# Patient Record
Sex: Male | Born: 1995 | Race: White | Hispanic: No | Marital: Single | Smoking: Never smoker
Health system: Southern US, Community
[De-identification: ages and names within clinical notes are randomized; demographics above are authoritative.]

## PROBLEM LIST (undated history)

## (undated) DIAGNOSIS — R002 Palpitations: Secondary | ICD-10-CM

## (undated) HISTORY — PX: WISDOM TOOTH EXTRACTION: SHX21

## (undated) HISTORY — DX: Palpitations: R00.2

---

## 2014-04-18 ENCOUNTER — Emergency Department (HOSPITAL_COMMUNITY)
Admission: EM | Admit: 2014-04-18 | Discharge: 2014-04-18 | Disposition: A | Discharge status: Home / Self Care | Payer: 59 | Attending: Emergency Medicine | Admitting: Emergency Medicine

## 2014-04-18 ENCOUNTER — Emergency Department (HOSPITAL_COMMUNITY): Payer: 59

## 2014-04-18 ENCOUNTER — Encounter (HOSPITAL_COMMUNITY): Payer: Self-pay | Admitting: Emergency Medicine

## 2014-04-18 DIAGNOSIS — R002 Palpitations: Secondary | ICD-10-CM

## 2014-04-18 DIAGNOSIS — I493 Ventricular premature depolarization: Secondary | ICD-10-CM | POA: Diagnosis not present

## 2014-04-18 LAB — RAPID URINE DRUG SCREEN, HOSP PERFORMED
AMPHETAMINES: NOT DETECTED
BARBITURATES: NOT DETECTED
BENZODIAZEPINES: NOT DETECTED
COCAINE: NOT DETECTED
Opiates: NOT DETECTED
Tetrahydrocannabinol: NOT DETECTED

## 2014-04-18 LAB — CBC
HEMATOCRIT: 43.3 % (ref 39.0–52.0)
HEMOGLOBIN: 15.4 g/dL (ref 13.0–17.0)
MCH: 31.6 pg (ref 26.0–34.0)
MCHC: 35.6 g/dL (ref 30.0–36.0)
MCV: 88.7 fL (ref 78.0–100.0)
Platelets: 216 10*3/uL (ref 150–400)
RBC: 4.88 MIL/uL (ref 4.22–5.81)
RDW: 11.7 % (ref 11.5–15.5)
WBC: 9.5 10*3/uL (ref 4.0–10.5)

## 2014-04-18 LAB — BASIC METABOLIC PANEL
Anion gap: 13 (ref 5–15)
BUN: 15 mg/dL (ref 6–23)
CALCIUM: 9.2 mg/dL (ref 8.4–10.5)
CO2: 24 mEq/L (ref 19–32)
CREATININE: 0.84 mg/dL (ref 0.50–1.35)
Chloride: 103 mEq/L (ref 96–112)
GLUCOSE: 102 mg/dL — AB (ref 70–99)
POTASSIUM: 3.7 meq/L (ref 3.7–5.3)
Sodium: 140 mEq/L (ref 137–147)

## 2014-04-18 LAB — MAGNESIUM: Magnesium: 2.2 mg/dL (ref 1.5–2.5)

## 2014-04-18 LAB — I-STAT TROPONIN, ED: Troponin i, poc: 0 ng/mL (ref 0.00–0.08)

## 2014-04-18 NOTE — ED Notes (Signed)
Pt states that he has been having intermittent palpitations off and on for approx 1 week; pt described it as a "subwoofer" in his chest; pt states that the palpitations got worse and more consistent today; pt denies chest pain; pt denies [facility]

## 2014-04-18 NOTE — ED Notes (Signed)
Pt became diaphoretic and pale when getting blood drawn, pt's blood pressure went into the 60's, blood pressure now 90/44, hr 61

## 2014-04-18 NOTE — ED Notes (Signed)
Pt states for the last 2 weeks has been feeling hear palpitations on/off, states tonight when going to bed it started but didn't stop like it normally does, pt states when it does happen he has no other symptoms, denies chest pain, denies SOB, pt states he feels fine at this time, pt in no distress.

## 2014-04-18 NOTE — ED Provider Notes (Signed)
CSN: [id number]     Arrival date & time 04/18/14  0057 History   First MD Initiated Contact with Patient 04/18/14 0214     Chief Complaint  Patient presents with  . Palpitations     (Consider location/radiation/quality/duration/timing/severity/associated sxs/prior Treatment) HPI Comments: Patient is a 18 year old otherwise healthy male who presents to the emergency department for evaluation of palpitations. He reports that for the past week he has been experiencing intermittent palpitations and a sensation of "heavy heartbeats". He reports that today the palpitations lasted longer than normal which is what prompted him to come to the emergency department. He has palpitations anywhere from a couple of beats to 1 minute. He is unsure of anything that triggers this. He denies any chest pain, shortness of breath, pleuritic pain, diaphoresis, lightheadedness. No syncope. No family history of early heart disease. Today he did have an episode of diaphoresis, lightheadedness, and became pale while he was getting his blood drawn. This resolved shortly after blood was drawn.  Patient is a 18 y.o. male presenting with palpitations. The history is provided by the patient. No language interpreter was used.  Palpitations Associated symptoms: no chest pain, no diaphoresis, no dizziness, no nausea, no shortness of breath and no vomiting     History reviewed. No pertinent past medical history. Past Surgical History  Procedure Laterality Date  . Wisdom tooth extraction     No family history on file. History  Substance Use Topics  . Smoking status: Never Smoker   . Smokeless tobacco: Not on file  . Alcohol Use: No    Review of Systems  Constitutional: Negative for fever, chills and diaphoresis.  Respiratory: Negative for shortness of breath.   Cardiovascular: Positive for palpitations. Negative for chest pain and leg swelling.  Gastrointestinal: Negative for nausea, vomiting and abdominal pain.   Neurological: Negative for dizziness, syncope and light-headedness.  All other systems reviewed and are negative.     Allergies  Review of patient's allergies indicates no known allergies.  Home Medications   Prior to Admission medications   Medication Sig Start Date End Date Taking? Authorizing Provider  ibuprofen (ADVIL,MOTRIN) 200 MG tablet Take 400 mg by mouth every 6 (six) hours as needed for moderate pain.   Yes Historical Provider, MD   BP 103/66  Pulse 76  Temp(Src) 98.1 F (36.7 C) (Oral)  Resp 17  Ht 5\' 9"  (1.753 m)  Wt 135 lb (61.236 kg)  BMI 19.93 kg/m2  SpO2 100% Physical Exam  Nursing note and vitals reviewed. Constitutional: He is oriented to person, place, and time. He appears well-developed and well-nourished. No distress.  HENT:  Head: Normocephalic and atraumatic.  Right Ear: External ear normal.  Left Ear: External ear normal.  Nose: Nose normal.  Eyes: Conjunctivae are normal.  Neck: Normal range of motion. No tracheal deviation present.  Cardiovascular: Normal rate, normal heart sounds, intact distal pulses and normal pulses.   Pulses:      Radial pulses are 2+ on the right side, and 2+ on the left side.  PVCs on cardiac exam.   Pulmonary/Chest: Effort normal and breath sounds normal. No stridor.  Abdominal: Soft. He exhibits no distension. There is no tenderness.  Musculoskeletal: Normal range of motion.  Neurological: He is alert and oriented to person, place, and time.  Skin: Skin is warm and dry. He is not diaphoretic.  Psychiatric: He has a normal mood and affect. His behavior is normal.    ED Course  Procedures (including critical  care time) Labs Review Labs Reviewed  BASIC METABOLIC PANEL - Abnormal; Notable for the following:    Glucose, Bld 102 (*)    All other components within normal limits  CBC  MAGNESIUM  URINE RAPID DRUG SCREEN (HOSP PERFORMED)  I-STAT TROPOININ, ED    Imaging Review Dg Chest 2 View  04/18/2014    CLINICAL DATA:  Palpitations  EXAM: CHEST  2 VIEW  COMPARISON:  None.  FINDINGS: Lungs are clear.  No pleural effusion or pneumothorax.  The heart is normal in size.  Visualized osseous structures are within normal limits.  IMPRESSION: Normal chest radiographs.   Electronically Signed   By: Charline BillsSriyesh  Krishnan M.D.   On: 04/18/2014 02:07     EKG Interpretation   Date/Time:  Thursday April 18 2014 01:07:43 EDT Ventricular Rate:  80 PR Interval:  145 QRS Duration: 102 QT Interval:  362 QTC Calculation: 418 R Axis:   26 Text Interpretation:  Sinus rhythm No acute changes Confirmed by Rhunette CroftNANAVATI,  MD, Janey GentaANKIT [phone]([id number]) on 04/18/2014 3:23:09 AM      MDM   Final diagnoses:  Palpitations    The patient is an otherwise healthy 18 year old male who presents emergency department for evaluation of palpitations. EKG shows PVCs. Labs are unremarkable. Patient denies lightheadedness, syncope, diaphoresis, nausea. He will followup with his primary care physician or cardiology. Discussed reasons to return to emergency Department immediately. Patient and mother expressed understanding. Vital signs stable for discharge. Discussed case with Dr. Rhunette CroftNanavati who agrees with plan. Patient / Family / Caregiver informed of clinical course, understand medical decision-making process, and agree with plan.     Mora BellmanHannah S Mylea Roarty, PA-C 04/18/14 [phone]

## 2014-04-18 NOTE — Discharge Instructions (Signed)
Palpitations °A palpitation is the feeling that your heartbeat is irregular or is faster than normal. It may feel like your heart is fluttering or skipping a beat. Palpitations are usually not a serious problem. However, in some cases, you may need further medical evaluation. °CAUSES  °Palpitations can be caused by: °· Smoking. °· Caffeine or other stimulants, such as diet pills or energy drinks. °· Alcohol. °· Stress and anxiety. °· Strenuous physical activity. °· Fatigue. °· Certain medicines. °· Heart disease, especially if you have a history of irregular heart rhythms (arrhythmias), such as atrial fibrillation, atrial flutter, or supraventricular tachycardia. °· An improperly working pacemaker or defibrillator. °DIAGNOSIS  °To find the cause of your palpitations, your health care provider will take your medical history and perform a physical exam. Your health care provider may also have you take a test called an ambulatory electrocardiogram (ECG). An ECG records your heartbeat patterns over a 24-hour period. You may also have other tests, such as: °· Transthoracic echocardiogram (TTE). During echocardiography, sound waves are used to evaluate how blood flows through your heart. °· Transesophageal echocardiogram (TEE). °· Cardiac monitoring. This allows your health care provider to monitor your heart rate and rhythm in real time. °· Holter monitor. This is a portable device that records your heartbeat and can help diagnose heart arrhythmias. It allows your health care provider to track your heart activity for several days, if needed. °· Stress tests by exercise or by giving medicine that makes the heart beat faster. °TREATMENT  °Treatment of palpitations depends on the cause of your symptoms and can vary greatly. Most cases of palpitations do not require any treatment other than time, relaxation, and monitoring your symptoms. Other causes, such as atrial fibrillation, atrial flutter, or supraventricular  tachycardia, usually require further treatment. °HOME CARE INSTRUCTIONS  °· Avoid: °¨ Caffeinated coffee, tea, soft drinks, diet pills, and energy drinks. °¨ Chocolate. °¨ Alcohol. °· Stop smoking if you smoke. °· Reduce your stress and anxiety. Things that can help you relax include: °¨ A method of controlling things in your body, such as your heartbeats, with your mind (biofeedback). °¨ Yoga. °¨ Meditation. °¨ Physical activity such as swimming, jogging, or walking. °· Get plenty of rest and sleep. °SEEK MEDICAL CARE IF:  °· You continue to have a fast or irregular heartbeat beyond 24 hours. °· Your palpitations occur more often. °SEEK IMMEDIATE MEDICAL CARE IF: °· You have chest pain or shortness of breath. °· You have a severe headache. °· You feel dizzy or you faint. °MAKE SURE YOU: °· Understand these instructions. °· Will watch your condition. °· Will get help right away if you are not doing well or get worse. °Document Released: 07/02/2000 Document Revised: 07/10/2013 Document Reviewed: 09/03/2011 °ExitCare® Patient Information ©2015 [employer]. This information is not intended to replace advice given to you by your health care provider. Make sure you discuss any questions you have with your health care provider. ° °Holter Monitoring °A Holter monitor is a small device with electrodes (small sticky patches) that attach to your chest. It records the electrical activity of your heart and is worn continuously for 24-48 hours.  °A HOLTER MONITOR IS USED TO °· Detect heart problems such as: °¨ Heart arrhythmia. Is an abnormal or irregular heartbeat. With some heart arrhythmias, you may not feel or know that you have an irregular heart rhythm. °¨ Palpitations, such as feeling your heart racing or fluttering. It is possible to have heart palpitations and not have   a heart arrhythmia. °¨ A heart rhythm that is too slow or too fast. °· If you have problems fainting, near fainting or feeling light-headed, a Holter  monitor may be worn to see if your heart is the cause. °HOLTER MONITOR PREPARATION  °· Electrodes will be attached to the skin on your chest. °· If you have hair on your chest, small areas may have to be shaved. This is done to help the patches stick better and make the recording more accurate. °· The electrodes are attached by wires to the Holter monitor. The Holter monitor clips to your clothing. You will wear the monitor at all times, even while exercising and sleeping. °HOME CARE INSTRUCTIONS  °· Wear your monitor at all times. °¨ The wires and the monitor must stay dry. Do not get the monitor wet. °¨ Do not bathe, swim or use a hot tub with it on. °¨ You may do a "sponge" bath while you have the monitor on. °· Keep your skin clean, do not put body lotion or moisturizer on your chest. °· It's possible that your skin under the electrodes could become irritated. To keep this from happening, you may put the electrodes in slightly different places on your chest. °· Your caregiver will also ask you to keep a diary of your activities, such as walking or doing chores. Be sure to note what you are doing if you experience heart symptoms such as palpitations. This will help your caregiver determine what might be contributing to your symptoms. The information stored in your monitor will be reviewed by your caregiver alongside your diary entries. °· Make sure the monitor is safely clipped to your clothing or in a location close to your body that your caregiver recommends. °· The monitor and electrodes are removed when the test is over. Return the monitor as directed. °· Be sure to follow up with your caregiver and discuss your Holter monitor results. °SEEK IMMEDIATE MEDICAL CARE IF: °· You faint or feel lightheaded. °· You have trouble breathing. °· You get pain in your chest, upper arm or jaw. °· You feel sick to your stomach and your skin is pale, cool, or damp. °· You think something is wrong with the way your heart is  beating. °MAKE SURE YOU:  °· Understand these instructions. °· Will watch your condition. °· Will get help right away if you are not doing well or get worse. °Document Released: 04/02/2004 Document Revised: 09/27/2011 Document Reviewed: 08/15/2008 °ExitCare® Patient Information ©2015 [employer]. This information is not intended to replace advice given to you by your health care provider. Make sure you discuss any questions you have with your health care provider. ° °

## 2014-04-19 ENCOUNTER — Encounter: Payer: Self-pay | Admitting: Cardiovascular Disease

## 2014-04-19 ENCOUNTER — Ambulatory Visit (INDEPENDENT_AMBULATORY_CARE_PROVIDER_SITE_OTHER): Payer: 59 | Admitting: Cardiovascular Disease

## 2014-04-19 VITALS — BP 102/64 | HR 65 | Ht 69.0 in | Wt 129.0 lb

## 2014-04-19 DIAGNOSIS — R002 Palpitations: Secondary | ICD-10-CM

## 2014-04-19 LAB — T4, FREE: Free T4: 1.04 ng/dL (ref 0.80–1.80)

## 2014-04-19 LAB — TSH: TSH: 1.474 u[IU]/mL (ref 0.350–4.500)

## 2014-04-19 NOTE — ED Provider Notes (Signed)
Medical screening examination/treatment/procedure(s) were conducted as a shared visit with non-physician practitioner(s) and myself.  I personally evaluated the patient during the encounter.   EKG Interpretation   Date/Time:  Thursday April 18 2014 01:07:43 EDT Ventricular Rate:  80 PR Interval:  145 QRS Duration: 102 QT Interval:  362 QTC Calculation: 418 R Axis:   26 Text Interpretation:  Sinus rhythm No acute changes Confirmed by Courtenay Creger,  MD, Antonia Jicha ([id number]) on 04/18/2014 3:23:09 AM      Pt with cc of palpitations. Denies substance abuse. No family hx of premature CAD or unexplained deaths and EKG shows PVC. No syncope. Will advice Cards f/u, as his symptoms are severe.  Derwood KaplanAnkit Cleophus Mendonsa, MD 04/19/14 [phone]

## 2014-04-19 NOTE — Assessment & Plan Note (Signed)
Manuel Gray is a 18 year old thin appearing single and healthy young male who went to the emergency room for symptomatic palpitations. These began about 2 weeks ago and become more frequent over the last week now occurring daily. He drinks 5 caffeinated beverages a day. He does not use decongestants. There is no other issues going on in his life. His left lateral normal at the hospital at EKG showed no acute changes. I suspect his palpitations are PVCs. I wonder why all of a sudden he is developing some increasing in frequency. I'm going to get thyroid function tests, 2-D echo and event monitor. I have counseled him to limit his caffeine intake. I will see him back in one month

## 2014-04-19 NOTE — Progress Notes (Signed)
04/19/2014 Alvino Blood   December 27, 1995  [id number]  Primary Physician No primary provider on file. Primary Cardiologist: Runell Gess MD Roseanne Reno   HPI:  Manuel Gray is a 18 year old appearing single Caucasian male by his mother today. He was referred by Ms. Emergency room for cardiovascular evaluation because of new-onset palpitations. He works as a Psychiatrist. He does not drink, smoke tobacco or use illicit drugs. He doesn't drink 3-5 caffeinated beverages a day. Otherwise his past medical history is unremarkable. There is no family history of heart disease. He's noticed palpitations beginning 2 weeks ago which have increased in frequency and severity over the last week prompting an ER visit. Workup there was unremarkable including routine lab work and EKG. The palpitations are not associated with any other symptoms such as chest pain shortness of breath or presyncope.   Current Outpatient Prescriptions  Medication Sig Dispense Refill  . ibuprofen (ADVIL,MOTRIN) 200 MG tablet Take 400 mg by mouth every 6 (six) hours as needed for moderate pain.       No current facility-administered medications for this visit.    No Known Allergies  History   Social History  . Marital Status: Single    Spouse Name: N/A    Number of Children: N/A  . Years of Education: N/A   Occupational History  . Not on file.   Social History Main Topics  . Smoking status: Never Smoker   . Smokeless tobacco: Not on file  . Alcohol Use: No  . Drug Use: No  . Sexual Activity: Not on file   Other Topics Concern  . Not on file   Social History Narrative  . No narrative on file     Review of Systems: General: negative for chills, fever, night sweats or weight changes.  Cardiovascular: negative for chest pain, dyspnea on exertion, edema, orthopnea, palpitations, paroxysmal nocturnal dyspnea or shortness of breath Dermatological: negative for rash Respiratory: negative for cough  or wheezing Urologic: negative for hematuria Abdominal: negative for nausea, vomiting, diarrhea, bright red blood per rectum, melena, or hematemesis Neurologic: negative for visual changes, syncope, or dizziness All other systems reviewed and are otherwise negative except as noted above.    Blood pressure 102/64, pulse 65, height 5\' 9"  (1.753 m), weight 129 lb (58.514 kg).  General appearance: alert and no distress Neck: no adenopathy, no carotid bruit, no JVD, supple, symmetrical, trachea midline and thyroid not enlarged, symmetric, no tenderness/mass/nodules Lungs: clear to auscultation bilaterally Heart: regular rate and rhythm, S1, S2 normal, no murmur, click, rub or gallop Extremities: extremities normal, atraumatic, no cyanosis or edema  EKG normal sinus rhythm at 65 without ST or T wave changes  ASSESSMENT AND PLAN:   Palpitations Preet is a 18 year old thin appearing single and healthy young male who went to the emergency room for symptomatic palpitations. These began about 2 weeks ago and become more frequent over the last week now occurring daily. He drinks 5 caffeinated beverages a day. He does not use decongestants. There is no other issues going on in his life. His left lateral normal at the hospital at EKG showed no acute changes. I suspect his palpitations are PVCs. I wonder why all of a sudden he is developing some increasing in frequency. I'm going to get thyroid function tests, 2-D echo and event monitor. I have counseled him to limit his caffeine intake. I will see him back in one month      Runell Gess MD  FACP,FACC,FAHA, FSCAI 04/19/2014 9:31 AM

## 2014-04-19 NOTE — Patient Instructions (Signed)
  We will see you back in follow up in 1 month with Dr Allyson SabalBerry.   Dr Allyson SabalBerry has ordered: 1.  Event monitor. Event monitors are medical devices that record the heart's electrical activity. Doctors most often us these monitors to diagnose arrhythmias. Arrhythmias are problems with the speed or rhythm of the heartbeat. The monitor is a small, portable device. You can wear one while you do your normal daily activities. This is usually used to diagnose what is causing palpitations/syncope (passing out).  2. Have blood work done today.   3.  Echocardiogram. Echocardiography is a painless test that uses sound waves to create images of your heart. It provides your doctor with information about the size and shape of your heart and how well your heart's chambers and valves are working. This procedure takes approximately one hour. There are no restrictions for this procedure.

## 2014-04-22 ENCOUNTER — Encounter: Payer: Self-pay | Admitting: *Deleted

## 2014-04-24 ENCOUNTER — Ambulatory Visit (HOSPITAL_COMMUNITY)
Admission: RE | Admit: 2014-04-24 | Discharge: 2014-04-24 | Disposition: A | Discharge status: Home / Self Care | Payer: 59 | Source: Ambulatory Visit | Attending: Cardiovascular Disease | Admitting: Cardiovascular Disease

## 2014-04-24 DIAGNOSIS — I369 Nonrheumatic tricuspid valve disorder, unspecified: Secondary | ICD-10-CM

## 2014-04-24 DIAGNOSIS — R002 Palpitations: Secondary | ICD-10-CM | POA: Insufficient documentation

## 2014-04-24 NOTE — Progress Notes (Signed)
2D Echo Performed 04/24/2014    Rahn Lacuesta, RCS  

## 2014-04-26 ENCOUNTER — Encounter: Payer: Self-pay | Admitting: *Deleted

## 2014-05-10 ENCOUNTER — Encounter: Payer: Self-pay | Admitting: Cardiovascular Disease

## 2014-06-04 ENCOUNTER — Ambulatory Visit (INDEPENDENT_AMBULATORY_CARE_PROVIDER_SITE_OTHER): Payer: 59 | Admitting: Cardiovascular Disease

## 2014-06-04 ENCOUNTER — Encounter: Payer: Self-pay | Admitting: Cardiovascular Disease

## 2014-06-04 VITALS — BP 100/62 | HR 80 | Ht 69.0 in | Wt 139.2 lb

## 2014-06-04 DIAGNOSIS — R002 Palpitations: Secondary | ICD-10-CM

## 2014-06-04 NOTE — Progress Notes (Signed)
Manuel PilgrimJacob returns for follow-up of his outpatient workup for palpitations. His lab work was unrevealing. His 2-D echo was normal. His event monitor showed only unifocal PVCs. I recently saw him and his mother that these are benign. His symptoms have markedly improved over the last several weeks. I will see him back as needed.  Runell GessJonathan J. Kaila Devries, M.D., FACP, [facility] LLCFACC, Earl LagosFAHA, [facility] [address] [place], [place]  [id number]  [phone] 06/04/2014 8:23 AM

## 2014-06-04 NOTE — Patient Instructions (Signed)
Follow up with Dr Berry as needed.  

## 2014-06-04 NOTE — Assessment & Plan Note (Addendum)
Manuel PilgrimJacob had a 2-D echo which was entirely normal as well as event monitor that showed unifocal PVCs. His TSH was normal as well. He has decreased his intake of caffeine. He says over the last several weeks his palpitations have markedly improved. I reassured him that these are most likely benign. I will see him back as needed.

## 2016-05-03 IMAGING — CR DG CHEST 2V
2 series · 2 of 2 positions shown · non-contrast
Comparison: None.

CLINICAL DATA: Palpitations

EXAM:
CHEST  2 VIEW

[w chest pa]
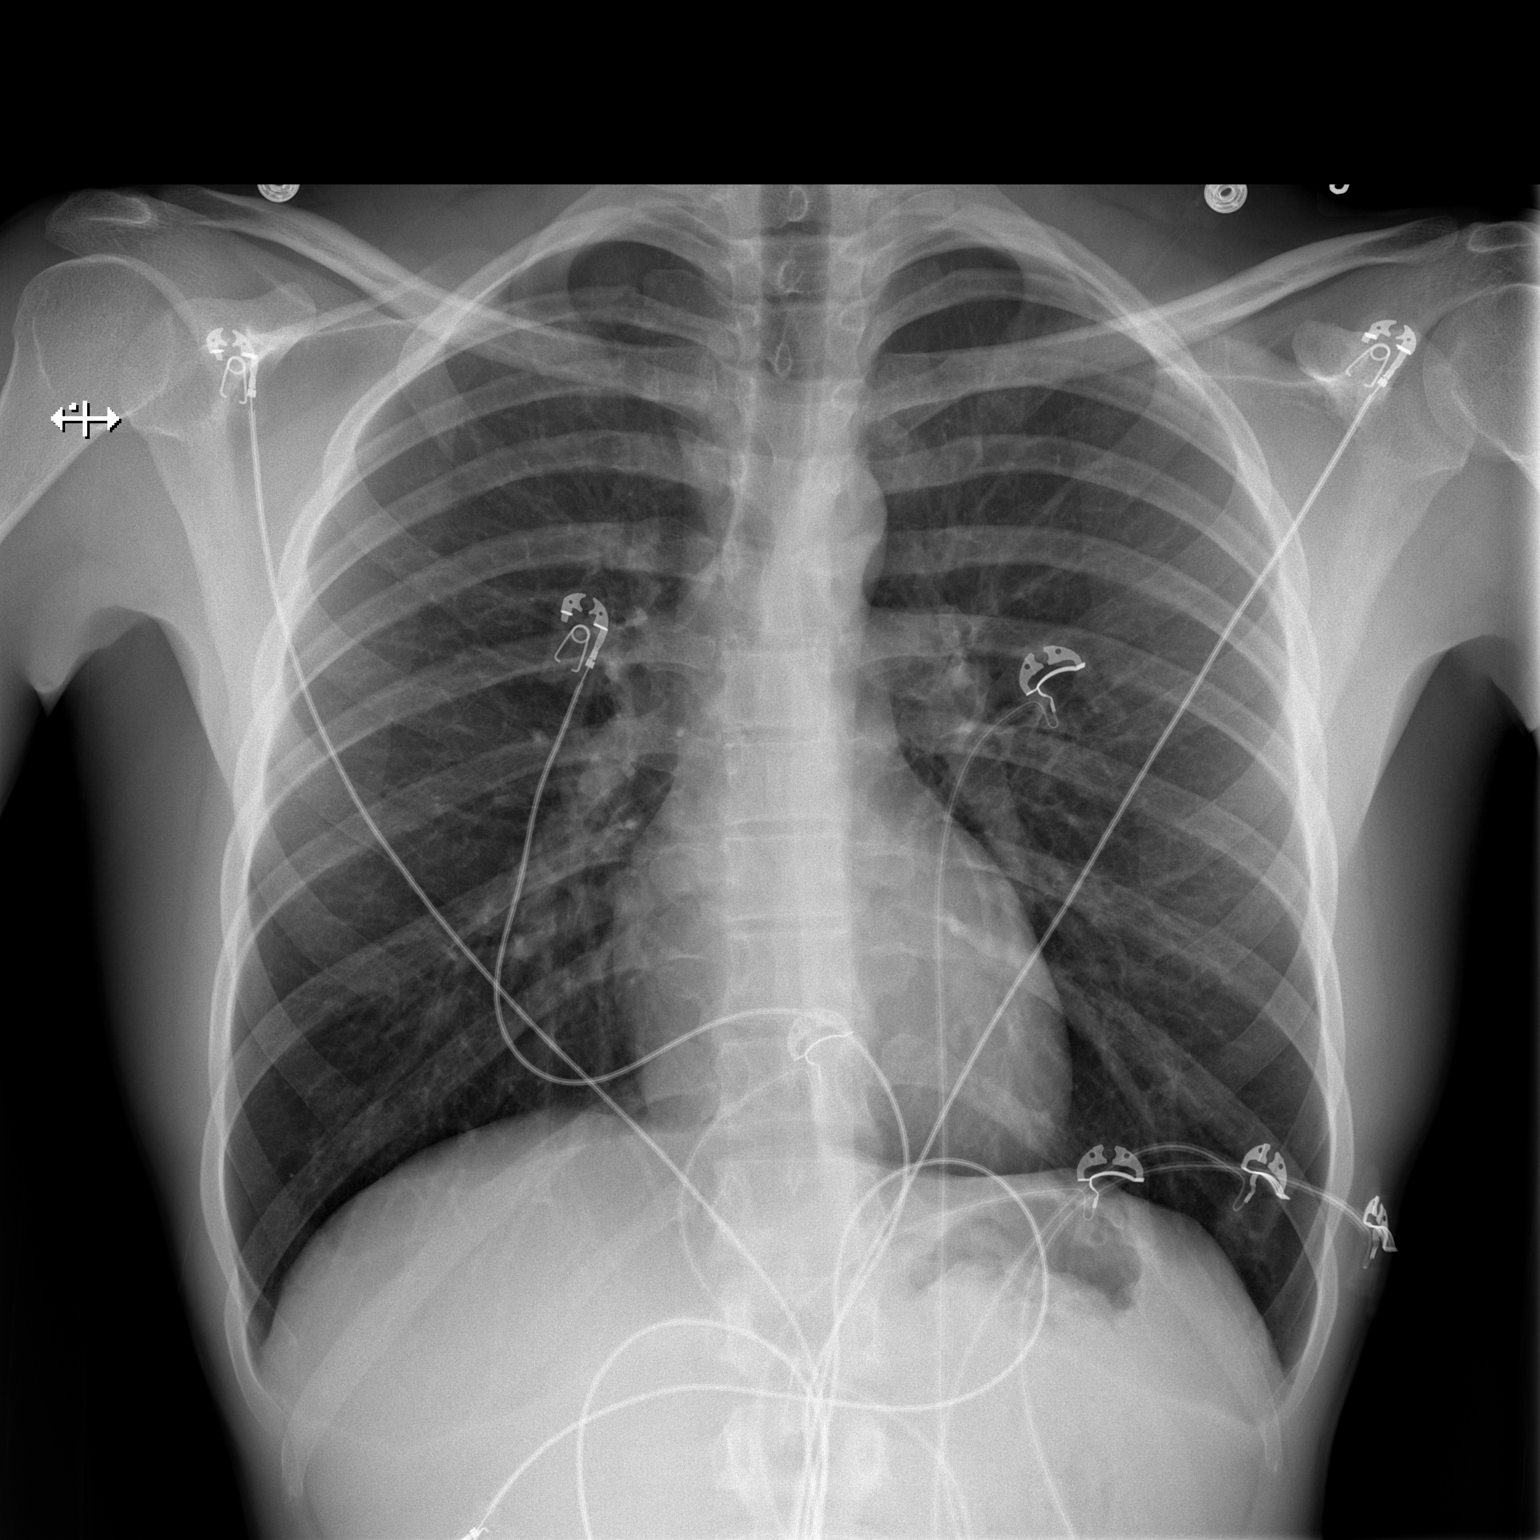

[w chest lat]
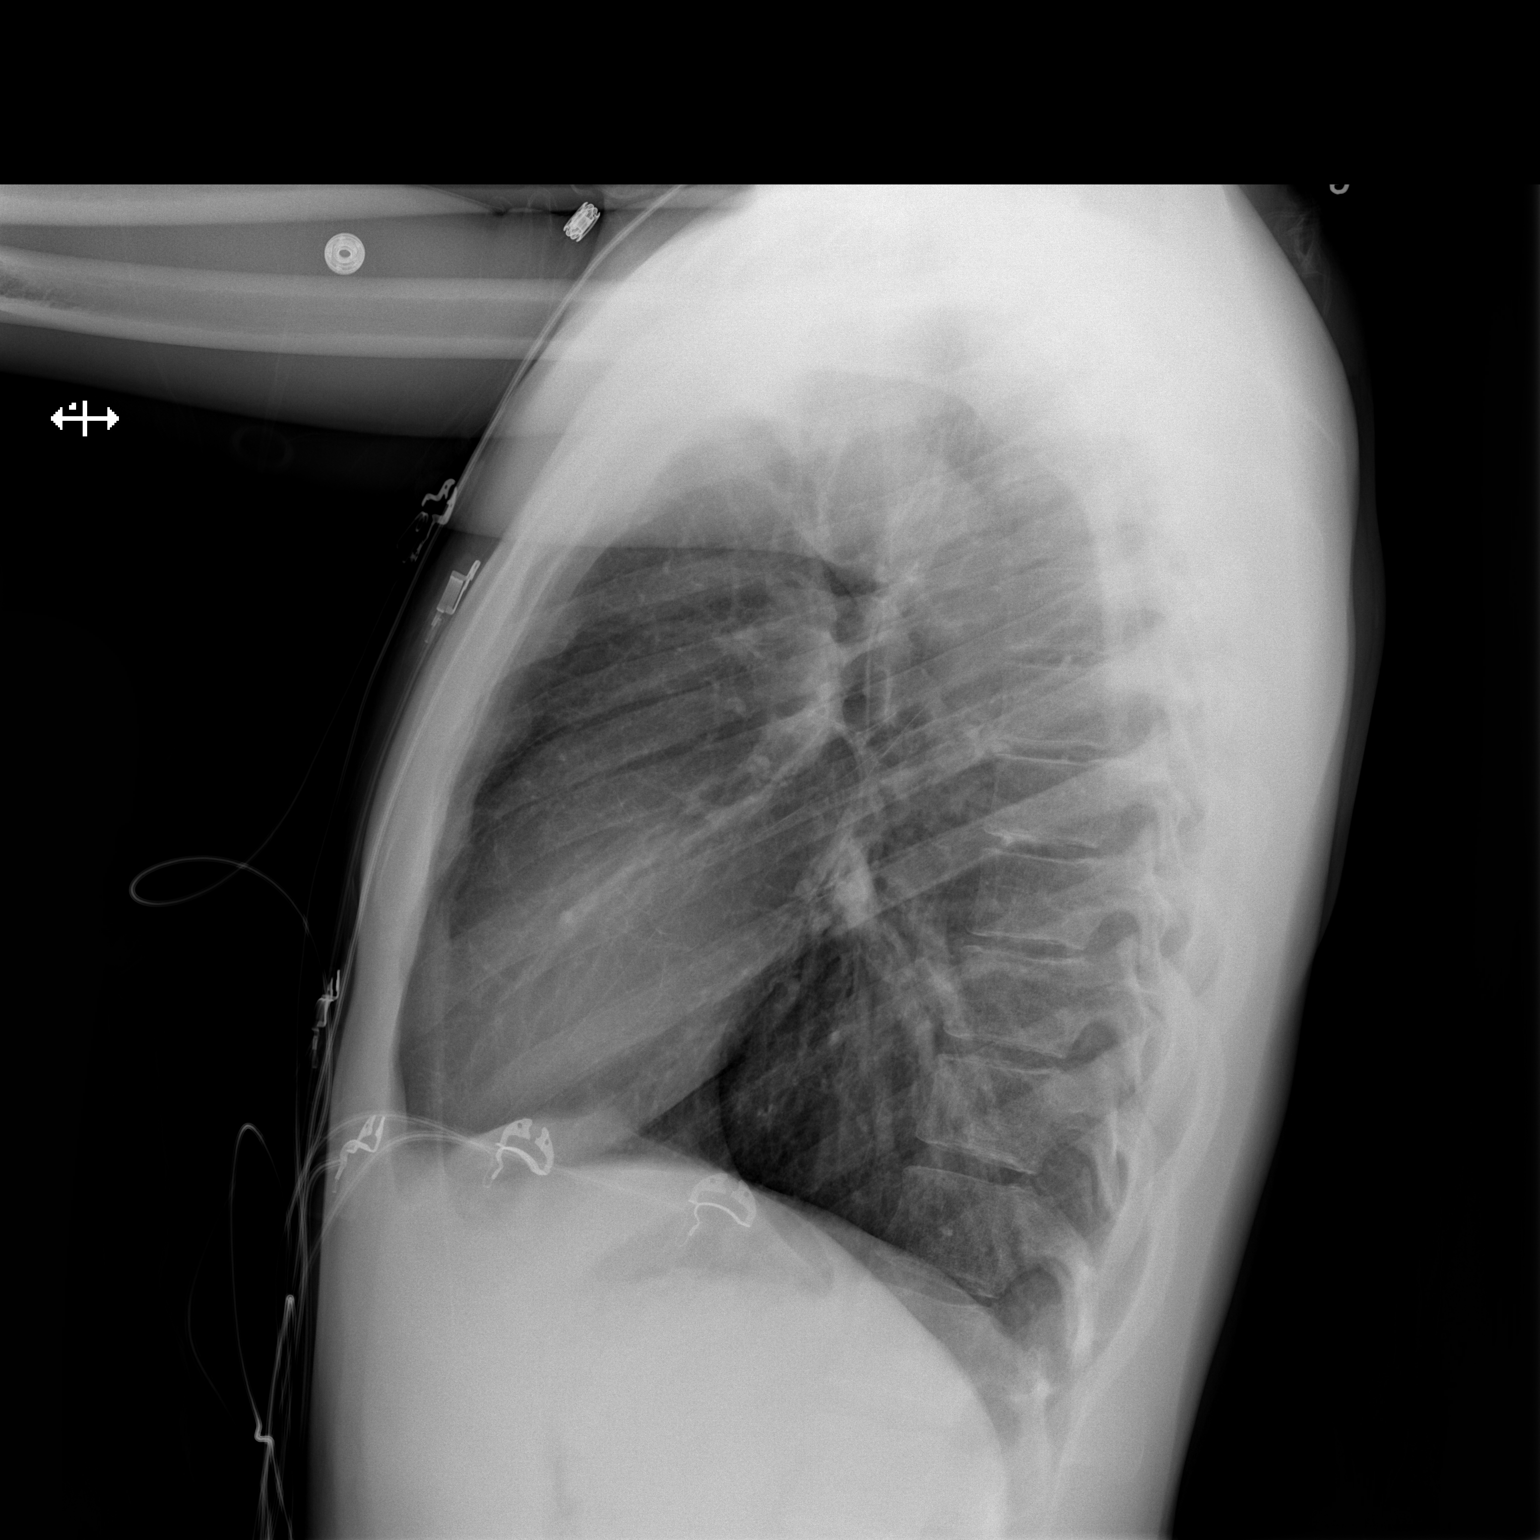

[2 of 2 positions shown; findings below may reference images not displayed]

FINDINGS: Lungs are clear.  No pleural effusion or pneumothorax.

The heart is normal in size.

Visualized osseous structures are within normal limits.
IMPRESSION: Normal chest radiographs.

## 2021-06-24 ENCOUNTER — Other Ambulatory Visit: Payer: Self-pay

## 2021-06-24 ENCOUNTER — Ambulatory Visit
Admission: EM | Admit: 2021-06-24 | Discharge: 2021-06-24 | Disposition: A | Discharge status: Home / Self Care | Payer: 59 | Attending: Internal Medicine | Admitting: Internal Medicine

## 2021-06-24 ENCOUNTER — Encounter: Payer: Self-pay | Admitting: Emergency Medicine

## 2021-06-24 DIAGNOSIS — J069 Acute upper respiratory infection, unspecified: Secondary | ICD-10-CM | POA: Insufficient documentation

## 2021-06-24 DIAGNOSIS — J029 Acute pharyngitis, unspecified: Secondary | ICD-10-CM | POA: Insufficient documentation

## 2021-06-24 LAB — POCT RAPID STREP A (OFFICE): Rapid Strep A Screen: NEGATIVE

## 2021-06-24 MED ORDER — FLUTICASONE PROPIONATE 50 MCG/ACT NA SUSP: 1.0000 | Freq: Every day | NASAL | 0 refills | Status: AC

## 2021-06-24 MED ORDER — CETIRIZINE HCL 10 MG PO TABS: 10.0000 mg | ORAL_TABLET | Freq: Every day | ORAL | 0 refills | Status: AC

## 2021-06-24 NOTE — Discharge Instructions (Signed)
You likely having a viral upper respiratory infection. We recommended symptom control. I expect your symptoms to start improving in the next 1-2 weeks.   1. Take a daily allergy pill/anti-histamine like Zyrtec, Claritin, or Store brand consistently for 2 weeks.  You have been prescribed this medication.  2. For congestion you may try an oral decongestant like Mucinex or sudafed. You may also try intranasal flonase nasal spray or saline irrigations (neti pot, sinus cleanse).  You have been prescribed Flonase.  3. For your sore throat you may try cepacol lozenges, salt water gargles, throat spray. Treatment of congestion may also help your sore throat.  4. For cough you may try Robitussen, Mucinex DM  5. Take Tylenol or Ibuprofen to help with pain/inflammation  6. Stay hydrated, drink plenty of fluids to keep throat coated and less irritated  Honey Tea For cough/sore throat try using a honey-based tea. Use 3 teaspoons of honey with juice squeezed from half lemon. Place shaved pieces of ginger into 1/2-1 cup of water and warm over stove top. Then mix the ingredients and repeat every 4 hours as needed.

## 2021-06-24 NOTE — ED Provider Notes (Signed)
[facility]    CSN: [id number] Arrival date & time: 06/24/21  5427      History   Chief Complaint No chief complaint on file.   HPI Manuel Gray is a 25 y.o. male.   Patient presents with 2-day history of sore throat and fever.  Patient reports that he did have a cough at the beginning of symptoms that is now resolved.  Also reports some mild nasal congestion and feelings of "ear fullness".  Patient is not sure of T-max at home but states that he "felt feverish" due to body aches and chills.  Denies any known sick contacts.  Denies chest pain, shortness of breath, nausea, vomiting, diarrhea, abdominal pain.  Patient has not yet taken any medications to alleviate symptoms.    Past Medical History:  Diagnosis Date   Palpitations    PVCs on the monitor    Patient Active Problem List   Diagnosis Date Noted   Palpitations 04/19/2014    Past Surgical History:  Procedure Laterality Date   WISDOM TOOTH EXTRACTION         Home Medications    Prior to Admission medications   Medication Sig Start Date End Date Taking? Authorizing Provider  cetirizine (ZYRTEC) 10 MG tablet Take 1 tablet (10 mg total) by mouth daily for 10 days. 06/24/21 07/04/21 Yes Thayer Embleton, Acie Fredrickson, FNP  fluticasone (FLONASE) 50 MCG/ACT nasal spray Place 1 spray into both nostrils daily for 3 days. 06/24/21 06/27/21 Yes Colbe Viviano, Acie Fredrickson, FNP  ibuprofen (ADVIL,MOTRIN) 200 MG tablet Take 400 mg by mouth every 6 (six) hours as needed for moderate pain.    [provider]    Family History History reviewed. No pertinent family history.  Social History Social History   Tobacco Use   Smoking status: Never  Substance Use Topics   Alcohol use: No   Drug use: No     Allergies   Patient has no known allergies.   Review of Systems Review of Systems Per HPI  Physical Exam Triage Vital Signs ED Triage Vitals  Enc Vitals Group     BP 06/24/21 0857 113/80     Pulse Rate 06/24/21  0857 (!) 102     Resp 06/24/21 0857 16     Temp 06/24/21 0857 98.2 F (36.8 C)     Temp Source 06/24/21 0857 Oral     SpO2 06/24/21 0857 97 %     Weight --      Height --      Head Circumference --      Peak Flow --      Pain Score 06/24/21 0859 5     Pain Loc --      Pain Edu? --      Excl. in GC? --    No data found.  Updated Vital Signs BP 113/80 (BP Location: Left Arm)   Pulse (!) 102   Temp 98.2 F (36.8 C) (Oral)   Resp 16   SpO2 97%   Visual Acuity Right Eye Distance:   Left Eye Distance:   Bilateral Distance:    Right Eye Near:   Left Eye Near:    Bilateral Near:     Physical Exam Constitutional:      General: He is not in acute distress.    Appearance: Normal appearance. He is not toxic-appearing or diaphoretic.  HENT:     Head: Normocephalic and atraumatic.     Right Ear: Ear canal normal. No drainage,  swelling or tenderness. A middle ear effusion is present. No mastoid tenderness. Tympanic membrane is not perforated, erythematous or bulging.     Left Ear: Ear canal normal. No drainage, swelling or tenderness. A middle ear effusion is present. No mastoid tenderness. Tympanic membrane is not perforated, erythematous, retracted or bulging.     Nose: Congestion present.     Mouth/Throat:     Mouth: Mucous membranes are moist.     Pharynx: Posterior oropharyngeal erythema present.  Eyes:     Extraocular Movements: Extraocular movements intact.     Conjunctiva/sclera: Conjunctivae normal.     Pupils: Pupils are equal, round, and reactive to light.  Cardiovascular:     Rate and Rhythm: Normal rate and regular rhythm.     Pulses: Normal pulses.     Heart sounds: Normal heart sounds.  Pulmonary:     Effort: Pulmonary effort is normal. No respiratory distress.     Breath sounds: Normal breath sounds. No stridor. No wheezing, rhonchi or rales.  Abdominal:     General: Abdomen is flat. Bowel sounds are normal.     Palpations: Abdomen is soft.   Musculoskeletal:        General: Normal range of motion.     Cervical back: Normal range of motion.  Skin:    General: Skin is warm and dry.  Neurological:     General: No focal deficit present.     Mental Status: He is alert and oriented to person, place, and time. Mental status is at baseline.  Psychiatric:        Mood and Affect: Mood normal.        Behavior: Behavior normal.     UC Treatments / Results  Labs (all labs ordered are listed, but only abnormal results are displayed) Labs Reviewed  CULTURE, GROUP A STREP ([facility])  COVID-19, FLU A+B [facility]  POCT RAPID STREP A (OFFICE)    EKG   Radiology No results found.  Procedures Procedures (including critical care time)  Medications Ordered in UC Medications - No data to display  Initial Impression / Assessment and Plan / UC Course  I have reviewed the triage vital signs and the nursing notes.  Pertinent labs & imaging results that were available during my care of the patient were reviewed by me and considered in my medical decision making (see chart for details).     Patient presents with symptoms likely from a viral upper respiratory infection. Differential includes bacterial pneumonia, sinusitis, allergic rhinitis,  Covid 19, flu. Do not suspect underlying cardiopulmonary process. Symptoms seem unlikely related to ACS, CHF or COPD exacerbations, pneumonia, pneumothorax. Patient is nontoxic appearing and not in need of emergent medical intervention.  Rapid strep test was negative.  Throat culture, COVID-19, flu swab pending.  Recommended symptom control with over the counter medications: Daily oral anti-histamine, Oral decongestant or IN corticosteroid, saline irrigations, cepacol lozenges, Robitussin, Delsym, honey tea.  Patient was offered prescriptions.  Return if symptoms fail to improve in 1-2 weeks or you develop shortness of breath, chest pain, severe headache. Patient states understanding and is  agreeable.  Discharged with PCP followup.  Final Clinical Impressions(s) / UC Diagnoses   Final diagnoses:  Sore throat  Viral upper respiratory infection     Discharge Instructions      You likely having a viral upper respiratory infection. We recommended symptom control. I expect your symptoms to start improving in the next 1-2 weeks.   1. Take a daily allergy pill/anti-histamine  like Zyrtec, Claritin, or Store brand consistently for 2 weeks.  You have been prescribed this medication.  2. For congestion you may try an oral decongestant like Mucinex or sudafed. You may also try intranasal flonase nasal spray or saline irrigations (neti pot, sinus cleanse).  You have been prescribed Flonase.  3. For your sore throat you may try cepacol lozenges, salt water gargles, throat spray. Treatment of congestion may also help your sore throat.  4. For cough you may try Robitussen, Mucinex DM  5. Take Tylenol or Ibuprofen to help with pain/inflammation  6. Stay hydrated, drink plenty of fluids to keep throat coated and less irritated  Honey Tea For cough/sore throat try using a honey-based tea. Use 3 teaspoons of honey with juice squeezed from half lemon. Place shaved pieces of ginger into 1/2-1 cup of water and warm over stove top. Then mix the ingredients and repeat every 4 hours as needed.     ED Prescriptions     Medication Sig Dispense Auth. Provider   cetirizine (ZYRTEC) 10 MG tablet Take 1 tablet (10 mg total) by mouth daily for 10 days. 30 tablet [place], [place] [address], [place]   fluticasone [facility]) 50 MCG/ACT nasal spray Place 1 spray into both nostrils daily for 3 days. 16 g [place]av[place] Bryant, [place]      PDMP not reviewed this encounter.   Gustavus Bryant, [place] 06/24/21 [phone]

## 2021-06-24 NOTE — ED Triage Notes (Signed)
Sore throat with a fever x 2 days

## 2021-06-25 LAB — COVID-19, FLU A+B NAA
Influenza A, NAA: NOT DETECTED
Influenza B, NAA: NOT DETECTED
SARS-CoV-2, NAA: DETECTED — AB

## 2021-06-27 LAB — CULTURE, GROUP A STREP (THRC)
